# Patient Record
Sex: Female | Born: 1950 | Race: Asian | Hispanic: No | Marital: Married | Smoking: Never smoker
Health system: Southern US, Community
[De-identification: ages and names within clinical notes are randomized; demographics above are authoritative.]

## PROBLEM LIST (undated history)

## (undated) DIAGNOSIS — M810 Age-related osteoporosis without current pathological fracture: Secondary | ICD-10-CM

## (undated) DIAGNOSIS — E119 Type 2 diabetes mellitus without complications: Secondary | ICD-10-CM

---

## 2016-08-15 ENCOUNTER — Encounter: Payer: Self-pay | Admitting: Intensive Care

## 2016-08-15 ENCOUNTER — Emergency Department: Payer: Medicaid - Out of State

## 2016-08-15 ENCOUNTER — Emergency Department
Admission: EM | Admit: 2016-08-15 | Discharge: 2016-08-15 | Disposition: A | Discharge status: Home / Self Care | Payer: Medicaid - Out of State | Attending: Student in an Organized Health Care Education/Training Program | Admitting: Student in an Organized Health Care Education/Training Program

## 2016-08-15 DIAGNOSIS — R000 Tachycardia, unspecified: Secondary | ICD-10-CM | POA: Diagnosis not present

## 2016-08-15 DIAGNOSIS — R05 Cough: Secondary | ICD-10-CM | POA: Diagnosis not present

## 2016-08-15 DIAGNOSIS — E119 Type 2 diabetes mellitus without complications: Secondary | ICD-10-CM | POA: Insufficient documentation

## 2016-08-15 DIAGNOSIS — R509 Fever, unspecified: Secondary | ICD-10-CM | POA: Insufficient documentation

## 2016-08-15 DIAGNOSIS — R059 Cough, unspecified: Secondary | ICD-10-CM

## 2016-08-15 HISTORY — DX: Type 2 diabetes mellitus without complications: E11.9

## 2016-08-15 HISTORY — DX: Age-related osteoporosis without current pathological fracture: M81.0

## 2016-08-15 LAB — URINALYSIS, COMPLETE (UACMP) WITH MICROSCOPIC
BACTERIA UA: NONE SEEN
BILIRUBIN URINE: NEGATIVE
Glucose, UA: 50 mg/dL — AB
KETONES UR: NEGATIVE mg/dL
LEUKOCYTES UA: NEGATIVE
NITRITE: NEGATIVE
PH: 5 (ref 5.0–8.0)
Protein, ur: NEGATIVE mg/dL
SPECIFIC GRAVITY, URINE: 1.011 (ref 1.005–1.030)

## 2016-08-15 LAB — CBC WITH DIFFERENTIAL/PLATELET
BASOS PCT: 0 %
Basophils Absolute: 0 10*3/uL (ref 0–0.1)
EOS ABS: 0 10*3/uL (ref 0–0.7)
EOS PCT: 0 %
HCT: 44.9 % (ref 35.0–47.0)
Hemoglobin: 14.8 g/dL (ref 12.0–16.0)
Lymphocytes Relative: 16 %
Lymphs Abs: 1.7 10*3/uL (ref 1.0–3.6)
MCH: 26.9 pg (ref 26.0–34.0)
MCHC: 32.9 g/dL (ref 32.0–36.0)
MCV: 81.7 fL (ref 80.0–100.0)
MONO ABS: 0.8 10*3/uL (ref 0.2–0.9)
MONOS PCT: 8 %
NEUTROS PCT: 76 %
Neutro Abs: 8.5 10*3/uL — ABNORMAL HIGH (ref 1.4–6.5)
Platelets: 307 10*3/uL (ref 150–440)
RBC: 5.49 MIL/uL — ABNORMAL HIGH (ref 3.80–5.20)
RDW: 13.5 % (ref 11.5–14.5)
WBC: 11.2 10*3/uL — ABNORMAL HIGH (ref 3.6–11.0)

## 2016-08-15 LAB — COMPREHENSIVE METABOLIC PANEL
ALBUMIN: 4.5 g/dL (ref 3.5–5.0)
ALT: 23 U/L (ref 14–54)
ANION GAP: 8 (ref 5–15)
AST: 24 U/L (ref 15–41)
Alkaline Phosphatase: 45 U/L (ref 38–126)
BUN: 18 mg/dL (ref 6–20)
CO2: 29 mmol/L (ref 22–32)
Calcium: 9.2 mg/dL (ref 8.9–10.3)
Chloride: 99 mmol/L — ABNORMAL LOW (ref 101–111)
Creatinine, Ser: 0.7 mg/dL (ref 0.44–1.00)
GFR calc non Af Amer: >60 mL/min (ref >60)
GLUCOSE: 240 mg/dL — AB (ref 65–99)
POTASSIUM: 3.7 mmol/L (ref 3.5–5.1)
SODIUM: 136 mmol/L (ref 135–145)
Total Bilirubin: 0.2 mg/dL — ABNORMAL LOW (ref 0.3–1.2)
Total Protein: 8 g/dL (ref 6.5–8.1)

## 2016-08-15 LAB — FIBRIN DERIVATIVES D-DIMER (ARMC ONLY): FIBRIN DERIVATIVES D-DIMER (ARMC): 353 (ref 0–499)

## 2016-08-15 MED ORDER — AZITHROMYCIN 250 MG PO TABS: ORAL_TABLET | ORAL | 0 refills | Status: AC

## 2016-08-15 MED ORDER — BENZONATATE 100 MG PO CAPS: 100.0000 mg | ORAL_CAPSULE | Freq: Four times a day (QID) | ORAL | 0 refills | Status: AC | PRN

## 2016-08-15 MED ORDER — SODIUM CHLORIDE 0.9 % IV BOLUS (SEPSIS)
500.0000 mL | Freq: Once | INTRAVENOUS | Status: AC
  Administered 2016-08-15: 500 mL via INTRAVENOUS

## 2016-08-15 MED ORDER — IBUPROFEN 600 MG PO TABS
600.0000 mg | ORAL_TABLET | Freq: Once | ORAL | Status: AC
Start: 2016-08-15 — End: 2016-08-15
  Administered 2016-08-15: 600 mg via ORAL
  Filled 2016-08-15: qty 1

## 2016-08-15 NOTE — ED Notes (Signed)

## 2016-08-15 NOTE — ED Triage Notes (Signed)
Patient presents to ER with cough, sore throat, and fever X3 days. Has been taking Robitussin at home with no relief for cough. Patient was living with son in [place] and came here on the Jan. 1st to visit daughter. Patient last took Tylenol at 2pm. Patient speaks Mandarin and interpreter stick was used to triage.

## 2016-08-15 NOTE — ED Provider Notes (Signed)
[facility] Emergency Department Provider Note  ____________________________________________  Time seen: Approximately 6:21 PM  I have reviewed the triage vital signs and the nursing notes.   HISTORY  Chief Complaint Fever and Cough   HPI Lauren Pitts is a 66 y.o. female who presents to the emergency department for evaluationof cough, sore throat and fever for the past 3 days. She has been taking Robitussin and Tylenol without relief. She is here visiting from [place]. She has a significant past medical history of diabetes, but denies chronic respiratory illness.   Past Medical History:  Diagnosis Date  . Diabetes mellitus without complication (HCC)   . Osteoporosis     There are no active problems to display for this patient.   History reviewed. No pertinent surgical history.  Prior to Admission medications   Medication Sig Start Date End Date Taking? Authorizing Provider  azithromycin (ZITHROMAX) 250 MG tablet 2 tablets today, then 1 tablet for the next 4 days. 08/15/16   Chinita Pester, FNP  benzonatate (TESSALON PERLES) 100 MG capsule Take 1 capsule (100 mg total) by mouth every 6 (six) hours as needed for cough. 08/15/16 08/15/17  Chinita Pester, FNP    Allergies Patient has no known allergies.  History reviewed. No pertinent family history.  Social History Social History  Substance Use Topics  . Smoking status: Never Smoker  . Smokeless tobacco: Never Used  . Alcohol use No    Review of Systems Constitutional: Positive for fever/chills ENT: Positive for sore throat. Cardiovascular: Denies chest pain. Respiratory: Negative for shortness of breath. Positive for cough. Gastrointestinal: Negative for nausea,  no vomiting.  Negative for diarrhea.  Musculoskeletal: Negative for body aches Skin: Negative for rash. Neurological: Negative for headaches ____________________________________________   PHYSICAL EXAM:  VITAL SIGNS: ED Triage  Vitals [08/15/16 1746]  Enc Vitals Group     BP 134/68     Pulse Rate (!) 130     Resp 18     Temp 100.2 F (37.9 C)     Temp Source Oral     SpO2 95 %     Weight 114 lb (51.7 kg)     Height 5\' 2"  (1.575 m)     Head Circumference      Peak Flow      Pain Score 3     Pain Loc      Pain Edu?      Excl. in GC?     Constitutional: Alert and oriented. Well appearing and in no acute distress. Eyes: Conjunctivae are normal. EOMI. Nose: Clear rhinnorhea. Mouth/Throat: Mucous membranes are moist.  Oropharynx mildly erythematous. Tonsils not visualized. Neck: No stridor.  Lymphatic: No cervical lymphadenopathy. Cardiovascular: Normal rate, regular rhythm. Grossly normal heart sounds.  Good peripheral circulation. Respiratory: Normal respiratory effort.  No retractions. Breath sounds clear to auscultation throughout. Gastrointestinal: Soft and nontender.  Musculoskeletal: FROM x 4 extremities.  Neurologic:  Normal speech and language.  Skin:  Skin is warm, dry and intact. No rash noted. Psychiatric: Mood and affect are normal. Speech and behavior are normal.  ____________________________________________   LABS (all labs ordered are listed, but only abnormal results are displayed)  Labs Reviewed  CBC WITH DIFFERENTIAL/PLATELET - Abnormal; Notable for the following:       Result Value   WBC 11.2 (*)    RBC 5.49 (*)    Neutro Abs 8.5 (*)    All other components within normal limits  COMPREHENSIVE METABOLIC PANEL -  Abnormal; Notable for the following:    Chloride 99 (*)    Glucose, Bld 240 (*)    Total Bilirubin 0.2 (*)    All other components within normal limits  URINALYSIS, COMPLETE (UACMP) WITH MICROSCOPIC - Abnormal; Notable for the following:    Color, Urine STRAW (*)    APPearance CLEAR (*)    Glucose, UA 50 (*)    Hgb urine dipstick MODERATE (*)    Squamous Epithelial / LPF 0-5 (*)    All other components within normal limits  FIBRIN DERIVATIVES D-DIMER [facility] ONLY)    ____________________________________________  EKG  Sinus tachycardia with a ventricular rate of 127 beats per minute. No evidence of S1 Q 3 T3 sign. Normal axis and no ectopy, no ST elevation. ____________________________________________  RADIOLOGY  Chest x-ray negative for acute cardiopulmonary abnormality per radiology. Minimal bronchiectasis in the left lower lobe with probable subsegmental atelectasis and possible scarring. ____________________________________________   PROCEDURES  Procedure(s) performed: None  Critical Care performed: No  ____________________________________________   INITIAL IMPRESSION / ASSESSMENT AND PLAN / ED COURSE  66 year old female presenting to the emergency department for evaluation of fever and cough for the past 3-4 days. Concern for pneumonia due to heart rate of 130 and temperature 100.2 after having taken Tylenol approximately an hour and half prior to arrival. Chest x-ray to be performed.  Clinical Course as of Aug 16 20  [name] Aug 15, 2016  [id number] Via medical interpreter, patient states that her heart rate typically is 90 to less than 110. She denies chest pain. She states that she has only been taking her daily medications, Robitussin, and Tylenol. She has traveled from [place] recently, but denies shortness of breath. Low suspicion for PE based on patient's symptoms, EKG, and respiratory rate. Will do a d-dimer and had some basic labs. Patient does state that she has had very loose stools for the past 3 days. She will be given a 500 mL bolus of normal saline.  [CT]    Clinical Course User Index [CT] Chinita Pesterari B Macon Sandiford, FNP    Pertinent labs & imaging results that were available during my care of the patient were reviewed by me and considered in my medical decision making (see chart for details).   After 1 L of fluid and ibuprofen, heart rate decreased to 109 which the patient states is her normal. Because she is out of town, has had a  fever, and no specific rationale for symptoms she will be treated for an atypical pneumonia with azithromycin. She was also given a prescription for [employer] Perles. She was instructed to continue taking her Tylenol for fever. She was given strict return precautions to return if any symptom gets worse while she is in town. She was advised that she should schedule a follow-up appointment with her primary care provider when she returns home to [place]. All communication was given through a medical interpreter and patient verbalized understanding. ____________________________________________   FINAL CLINICAL IMPRESSION(S) / ED DIAGNOSES  Final diagnoses:  Cough in adult patient  Fever, unspecified fever cause  Tachycardia    Note:  This document was prepared using Dragon voice recognition software and may include unintentional dictation errors.     Chinita PesterCari B Leathia Farnell, FNP 08/16/16 0023    Willy EddyPatrick Robinson, MD 08/16/16 2055

## 2017-12-20 IMAGING — CR DG CHEST 2V
2 series · 2 of 2 positions shown · non-contrast
Comparison: Report from 10/02/2002

CLINICAL DATA: Cough, sore throat and fever.

EXAM:
CHEST  2 VIEW

[chest pa]
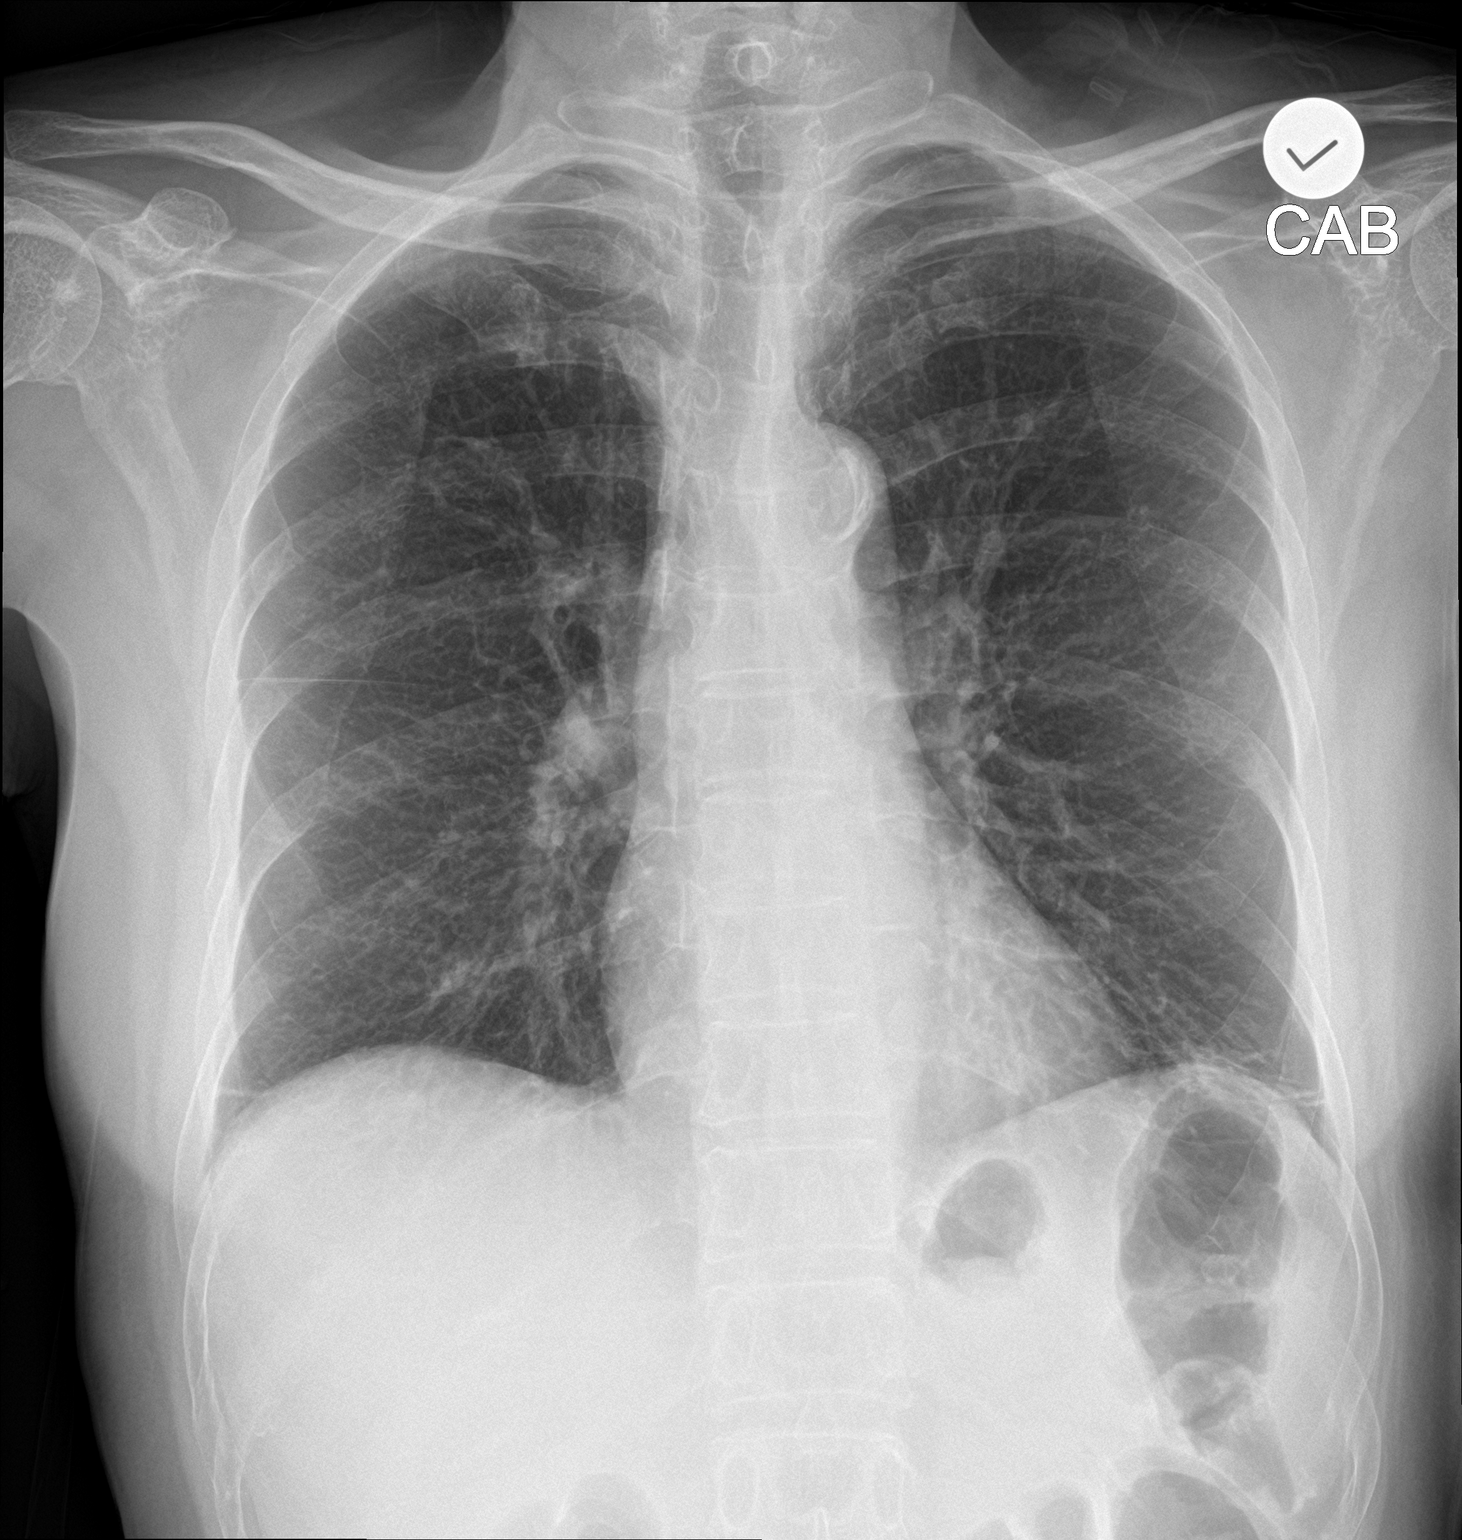

[chest lat]
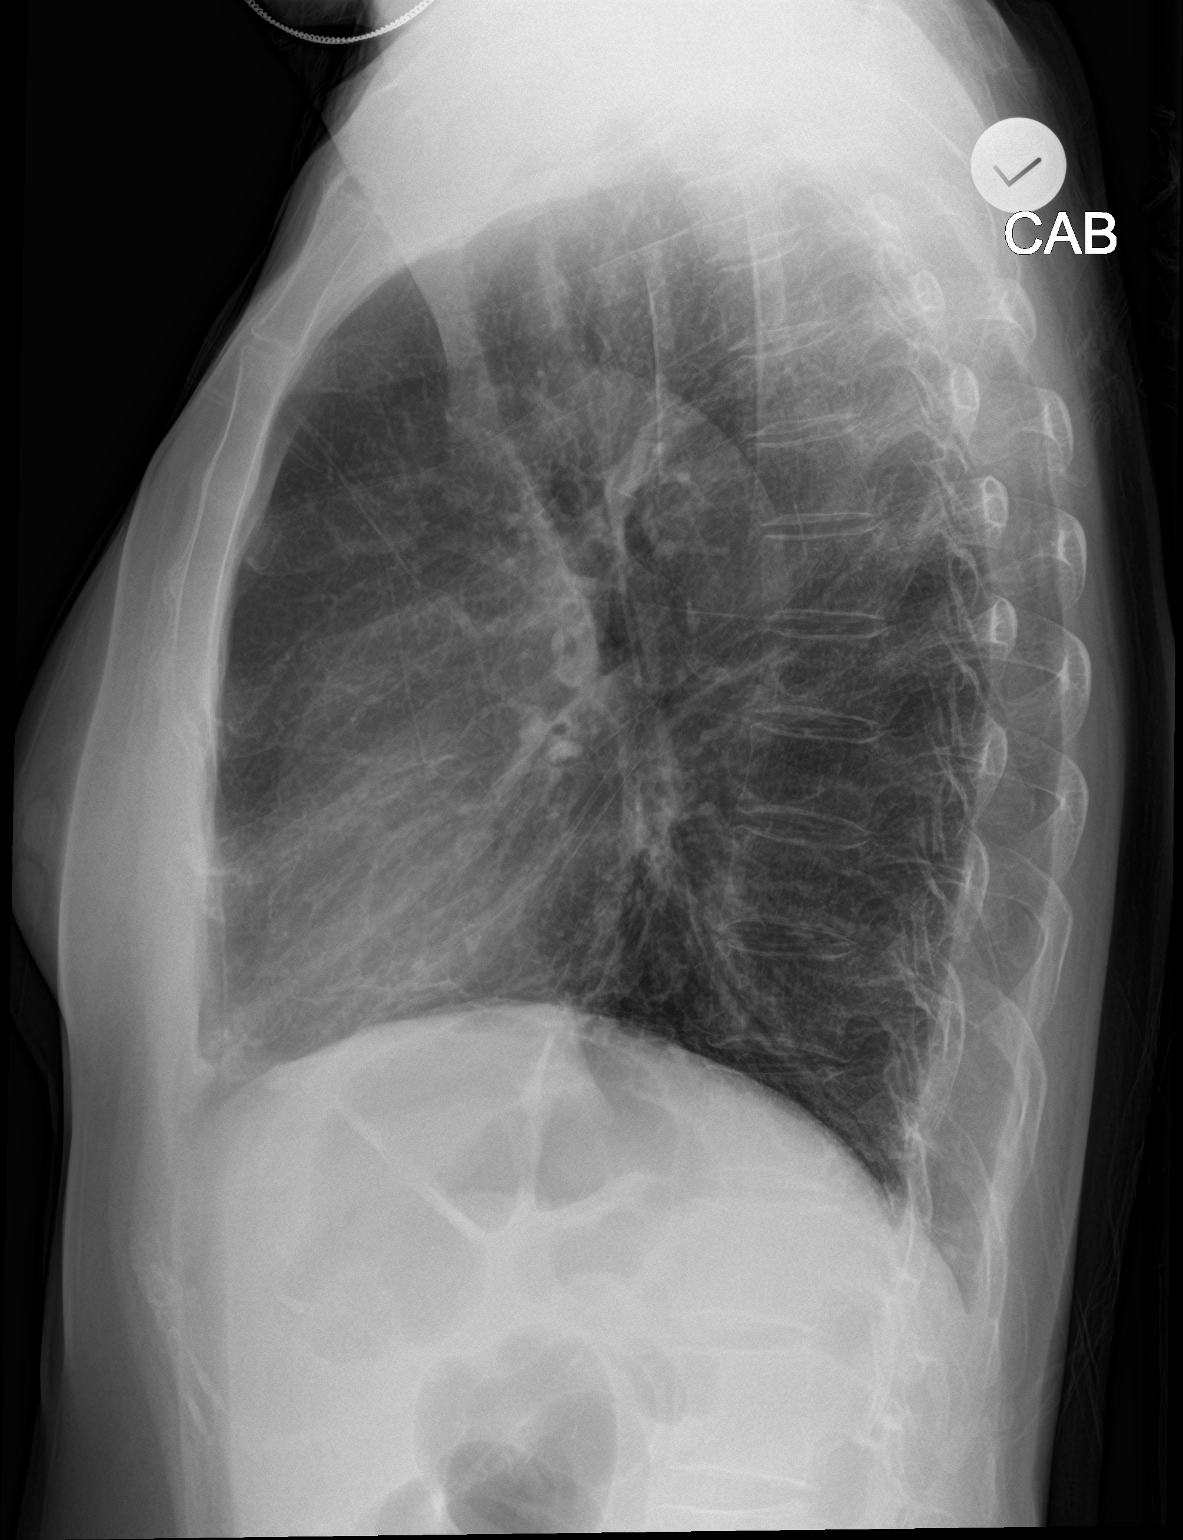

[2 of 2 positions shown; findings below may reference images not displayed]

FINDINGS: Heart size is normal. Aortic atherosclerosis at the arch.
Subsegmental atelectasis and/or scarring at the left lung base. Mild
bronchiectasis also suggested in the left lower lobe. No pulmonary
consolidation, effusion or pneumothorax. Probable bone island over
the right humeral head. No acute appearing osseous abnormality.
IMPRESSION: Aortic atherosclerosis. Minimal bronchiectasis in the left lower
lobe with probable sub segmental atelectasis and/or scarring. No
acute pulmonary disease.
